# Patient Record
Sex: Male | Born: 1960 | Race: Black or African American | Hispanic: No | Marital: Married | Smoking: Current every day smoker
Health system: Southern US, Community
[De-identification: ages and names within clinical notes are randomized; demographics above are authoritative.]

## PROBLEM LIST (undated history)

## (undated) DIAGNOSIS — Z789 Other specified health status: Secondary | ICD-10-CM

## (undated) HISTORY — PX: BACK SURGERY: SHX140

---

## 2009-05-04 ENCOUNTER — Emergency Department (HOSPITAL_COMMUNITY): Admission: EM | Admit: 2009-05-04 | Discharge: 2009-05-04 | Payer: Self-pay | Admitting: Emergency Medicine

## 2009-07-21 ENCOUNTER — Inpatient Hospital Stay: Payer: Self-pay | Admitting: Internal Medicine

## 2009-08-08 ENCOUNTER — Ambulatory Visit: Payer: Self-pay | Admitting: Internal Medicine

## 2009-09-03 ENCOUNTER — Encounter (INDEPENDENT_AMBULATORY_CARE_PROVIDER_SITE_OTHER): Payer: Self-pay | Admitting: Family Medicine

## 2009-09-03 ENCOUNTER — Ambulatory Visit: Payer: Self-pay | Admitting: Internal Medicine

## 2009-09-03 LAB — CONVERTED CEMR LAB
ALT: 11 units/L (ref 0–53)
AST: 20 units/L (ref 0–37)
Albumin: 4.1 g/dL (ref 3.5–5.2)
Alkaline Phosphatase: 64 units/L (ref 39–117)
BUN: 9 mg/dL (ref 6–23)
Basophils Absolute: 0 10*3/uL (ref 0.0–0.1)
Basophils Relative: 0 % (ref 0–1)
CO2: 24 meq/L (ref 19–32)
Calcium: 8.8 mg/dL (ref 8.4–10.5)
Chloride: 103 meq/L (ref 96–112)
Cholesterol: 206 mg/dL — ABNORMAL HIGH (ref 0–200)
Creatinine, Ser: 0.84 mg/dL (ref 0.40–1.50)
Eosinophils Absolute: 0.1 10*3/uL (ref 0.0–0.7)
Eosinophils Relative: 1 % (ref 0–5)
Glucose, Bld: 111 mg/dL — ABNORMAL HIGH (ref 70–99)
HCT: 42.8 % (ref 39.0–52.0)
HDL: 56 mg/dL (ref >39)
Helicobacter Pylori Antibody-IgG: 2.8 — ABNORMAL HIGH
Hemoglobin: 14 g/dL (ref 13.0–17.0)
Hepatitis B Surface Ag: NEGATIVE
LDL Cholesterol: 132 mg/dL — ABNORMAL HIGH (ref 0–99)
Lymphocytes Relative: 21 % (ref 12–46)
Lymphs Abs: 2 10*3/uL (ref 0.7–4.0)
MCHC: 32.7 g/dL (ref 30.0–36.0)
MCV: 86.6 fL (ref 78.0–100.0)
Monocytes Absolute: 0.6 10*3/uL (ref 0.1–1.0)
Monocytes Relative: 6 % (ref 3–12)
Neutro Abs: 6.6 10*3/uL (ref 1.7–7.7)
Neutrophils Relative %: 71 % (ref 43–77)
Platelets: 287 10*3/uL (ref 150–400)
Potassium: 3.7 meq/L (ref 3.5–5.3)
RBC: 4.94 M/uL (ref 4.22–5.81)
RDW: 15 % (ref 11.5–15.5)
Sodium: 137 meq/L (ref 135–145)
Total Bilirubin: 0.6 mg/dL (ref 0.3–1.2)
Total CHOL/HDL Ratio: 3.7
Total Protein: 7.2 g/dL (ref 6.0–8.3)
Triglycerides: 92 mg/dL (ref <150)
VLDL: 18 mg/dL (ref 0–40)
WBC: 9.2 10*3/uL (ref 4.0–10.5)

## 2009-09-05 ENCOUNTER — Ambulatory Visit: Payer: Self-pay | Admitting: Internal Medicine

## 2009-09-24 ENCOUNTER — Ambulatory Visit: Payer: Self-pay | Admitting: Family Medicine

## 2010-01-07 ENCOUNTER — Ambulatory Visit: Payer: Self-pay | Admitting: Family Medicine

## 2010-04-08 ENCOUNTER — Emergency Department (HOSPITAL_COMMUNITY): Admission: EM | Admit: 2010-04-08 | Discharge: 2010-04-08 | Payer: Self-pay | Admitting: Emergency Medicine

## 2010-04-11 ENCOUNTER — Encounter (INDEPENDENT_AMBULATORY_CARE_PROVIDER_SITE_OTHER): Payer: Self-pay | Admitting: *Deleted

## 2010-04-11 LAB — CONVERTED CEMR LAB
TSH: 2.072 microintl units/mL (ref 0.350–4.500)
Vit D, 25-Hydroxy: 20 ng/mL — ABNORMAL LOW (ref 30–89)

## 2010-05-11 ENCOUNTER — Emergency Department (HOSPITAL_COMMUNITY)
Admission: EM | Admit: 2010-05-11 | Discharge: 2010-05-11 | Payer: Self-pay | Source: Home / Self Care | Admitting: Emergency Medicine

## 2010-07-10 ENCOUNTER — Emergency Department (HOSPITAL_COMMUNITY)
Admission: EM | Admit: 2010-07-10 | Discharge: 2010-07-10 | Payer: Self-pay | Source: Home / Self Care | Admitting: Emergency Medicine

## 2010-07-10 LAB — CBC
HCT: 41.7 % (ref 39.0–52.0)
Hemoglobin: 13.8 g/dL (ref 13.0–17.0)
MCH: 28.5 pg (ref 26.0–34.0)
MCHC: 33.1 g/dL (ref 30.0–36.0)
MCV: 86.2 fL (ref 78.0–100.0)
Platelets: 253 10*3/uL (ref 150–400)
RBC: 4.84 MIL/uL (ref 4.22–5.81)
RDW: 14.4 % (ref 11.5–15.5)
WBC: 6.7 10*3/uL (ref 4.0–10.5)

## 2010-07-10 LAB — HEPATIC FUNCTION PANEL
ALT: 16 U/L (ref 0–53)
AST: 28 U/L (ref 0–37)
Albumin: 3.4 g/dL — ABNORMAL LOW (ref 3.5–5.2)
Alkaline Phosphatase: 60 U/L (ref 39–117)
Bilirubin, Direct: 0.2 mg/dL (ref 0.0–0.3)
Indirect Bilirubin: 0.2 mg/dL — ABNORMAL LOW (ref 0.3–0.9)
Total Bilirubin: 0.4 mg/dL (ref 0.3–1.2)
Total Protein: 6.7 g/dL (ref 6.0–8.3)

## 2010-07-10 LAB — BASIC METABOLIC PANEL
BUN: 9 mg/dL (ref 6–23)
CO2: 27 mEq/L (ref 19–32)
Calcium: 8.8 mg/dL (ref 8.4–10.5)
Chloride: 107 mEq/L (ref 96–112)
Creatinine, Ser: 0.93 mg/dL (ref 0.4–1.5)
GFR calc Af Amer: >60 mL/min (ref >60)
GFR calc non Af Amer: >60 mL/min (ref >60)
Glucose, Bld: 110 mg/dL — ABNORMAL HIGH (ref 70–99)
Potassium: 4.4 mEq/L (ref 3.5–5.1)
Sodium: 141 mEq/L (ref 135–145)

## 2010-07-10 LAB — DIFFERENTIAL
Basophils Absolute: 0 10*3/uL (ref 0.0–0.1)
Basophils Relative: 0 % (ref 0–1)
Eosinophils Absolute: 0.2 10*3/uL (ref 0.0–0.7)
Eosinophils Relative: 3 % (ref 0–5)
Lymphocytes Relative: 37 % (ref 12–46)
Lymphs Abs: 2.4 10*3/uL (ref 0.7–4.0)
Monocytes Absolute: 0.4 10*3/uL (ref 0.1–1.0)
Monocytes Relative: 7 % (ref 3–12)
Neutro Abs: 3.6 10*3/uL (ref 1.7–7.7)
Neutrophils Relative %: 54 % (ref 43–77)

## 2010-07-10 LAB — RAPID URINE DRUG SCREEN, HOSP PERFORMED
Amphetamines: NOT DETECTED
Barbiturates: NOT DETECTED
Benzodiazepines: NOT DETECTED
Cocaine: POSITIVE — AB
Opiates: POSITIVE — AB
Tetrahydrocannabinol: POSITIVE — AB

## 2010-07-10 LAB — ETHANOL: Alcohol, Ethyl (B): <5 mg/dL (ref 0–10)

## 2010-07-11 ENCOUNTER — Emergency Department: Payer: Self-pay | Admitting: Unknown Physician Specialty

## 2010-09-16 LAB — DIFFERENTIAL
Basophils Absolute: 0 10*3/uL (ref 0.0–0.1)
Basophils Relative: 0 % (ref 0–1)
Eosinophils Absolute: 0 10*3/uL (ref 0.0–0.7)
Eosinophils Relative: 0 % (ref 0–5)
Lymphocytes Relative: 14 % (ref 12–46)
Lymphs Abs: 1.3 10*3/uL (ref 0.7–4.0)
Monocytes Absolute: 0.4 10*3/uL (ref 0.1–1.0)
Monocytes Relative: 4 % (ref 3–12)
Neutro Abs: 8.1 10*3/uL — ABNORMAL HIGH (ref 1.7–7.7)
Neutrophils Relative %: 83 % — ABNORMAL HIGH (ref 43–77)

## 2010-09-16 LAB — BASIC METABOLIC PANEL
BUN: 13 mg/dL (ref 6–23)
CO2: 28 mEq/L (ref 19–32)
Calcium: 9.4 mg/dL (ref 8.4–10.5)
Chloride: 99 mEq/L (ref 96–112)
Creatinine, Ser: 1.12 mg/dL (ref 0.4–1.5)
GFR calc Af Amer: >60 mL/min (ref >60)
GFR calc non Af Amer: >60 mL/min (ref >60)
Glucose, Bld: 189 mg/dL — ABNORMAL HIGH (ref 70–99)
Potassium: 4.3 mEq/L (ref 3.5–5.1)
Sodium: 138 mEq/L (ref 135–145)

## 2010-09-16 LAB — CBC
HCT: 51 % (ref 39.0–52.0)
Hemoglobin: 17.2 g/dL — ABNORMAL HIGH (ref 13.0–17.0)
MCH: 29.1 pg (ref 26.0–34.0)
MCHC: 33.7 g/dL (ref 30.0–36.0)
MCV: 86.4 fL (ref 78.0–100.0)
Platelets: 278 10*3/uL (ref 150–400)
RBC: 5.91 MIL/uL — ABNORMAL HIGH (ref 4.22–5.81)
RDW: 13 % (ref 11.5–15.5)
WBC: 9.8 10*3/uL (ref 4.0–10.5)

## 2010-09-16 LAB — BRAIN NATRIURETIC PEPTIDE: Pro B Natriuretic peptide (BNP): <30 pg/mL (ref 0.0–100.0)

## 2010-09-16 LAB — POCT CARDIAC MARKERS
CKMB, poc: <1 ng/mL — ABNORMAL LOW (ref 1.0–8.0)
Myoglobin, poc: 149 ng/mL (ref 12–200)
Troponin i, poc: <0.05 ng/mL (ref 0.00–0.09)

## 2010-10-09 LAB — URINE MICROSCOPIC-ADD ON

## 2010-10-09 LAB — COMPREHENSIVE METABOLIC PANEL
ALT: 13 U/L (ref 0–53)
AST: 21 U/L (ref 0–37)
Albumin: 4.5 g/dL (ref 3.5–5.2)
Alkaline Phosphatase: 67 U/L (ref 39–117)
BUN: 7 mg/dL (ref 6–23)
CO2: 28 mEq/L (ref 19–32)
Calcium: 9.5 mg/dL (ref 8.4–10.5)
Chloride: 98 mEq/L (ref 96–112)
Creatinine, Ser: 1.2 mg/dL (ref 0.4–1.5)
GFR calc Af Amer: >60 mL/min (ref >60)
GFR calc non Af Amer: >60 mL/min (ref >60)
Glucose, Bld: 115 mg/dL — ABNORMAL HIGH (ref 70–99)
Potassium: 4.2 mEq/L (ref 3.5–5.1)
Sodium: 136 mEq/L (ref 135–145)
Total Bilirubin: 1.2 mg/dL (ref 0.3–1.2)
Total Protein: 8.5 g/dL — ABNORMAL HIGH (ref 6.0–8.3)

## 2010-10-09 LAB — URINALYSIS, ROUTINE W REFLEX MICROSCOPIC
Glucose, UA: NEGATIVE mg/dL
Hgb urine dipstick: NEGATIVE
Ketones, ur: >80 mg/dL — AB
Leukocytes, UA: NEGATIVE
Nitrite: NEGATIVE
Protein, ur: 30 mg/dL — AB
Specific Gravity, Urine: 1.036 — ABNORMAL HIGH (ref 1.005–1.030)
Urobilinogen, UA: 1 mg/dL (ref 0.0–1.0)
pH: 5.5 (ref 5.0–8.0)

## 2010-10-09 LAB — LACTATE DEHYDROGENASE: LDH: 176 U/L (ref 94–250)

## 2010-10-09 LAB — POCT CARDIAC MARKERS
CKMB, poc: <1 ng/mL — ABNORMAL LOW (ref 1.0–8.0)
Myoglobin, poc: 130 ng/mL (ref 12–200)
Troponin i, poc: <0.05 ng/mL (ref 0.00–0.09)

## 2010-10-09 LAB — DIFFERENTIAL
Basophils Absolute: 0 10*3/uL (ref 0.0–0.1)
Basophils Relative: 0 % (ref 0–1)
Eosinophils Absolute: 0 10*3/uL (ref 0.0–0.7)
Eosinophils Relative: 1 % (ref 0–5)
Lymphocytes Relative: 23 % (ref 12–46)
Lymphs Abs: 2.1 10*3/uL (ref 0.7–4.0)
Monocytes Absolute: 0.5 10*3/uL (ref 0.1–1.0)
Monocytes Relative: 6 % (ref 3–12)
Neutro Abs: 6.4 10*3/uL (ref 1.7–7.7)
Neutrophils Relative %: 71 % (ref 43–77)

## 2010-10-09 LAB — CBC
HCT: 50.8 % (ref 39.0–52.0)
Hemoglobin: 17.2 g/dL — ABNORMAL HIGH (ref 13.0–17.0)
MCHC: 33.9 g/dL (ref 30.0–36.0)
MCV: 88.6 fL (ref 78.0–100.0)
Platelets: 267 10*3/uL (ref 150–400)
RBC: 5.73 MIL/uL (ref 4.22–5.81)
RDW: 13 % (ref 11.5–15.5)
WBC: 9.1 10*3/uL (ref 4.0–10.5)

## 2010-10-09 LAB — LIPASE, BLOOD: Lipase: 27 U/L (ref 11–59)

## 2011-02-26 IMAGING — CR DG CHEST 2V
2 series · 2 of 2 positions shown · non-contrast
Comparison: None.

CLINICAL DATA: Abdominal pain.  Cough.  Recent pneumonia.

CHEST - 2 VIEW

[w chest pa]
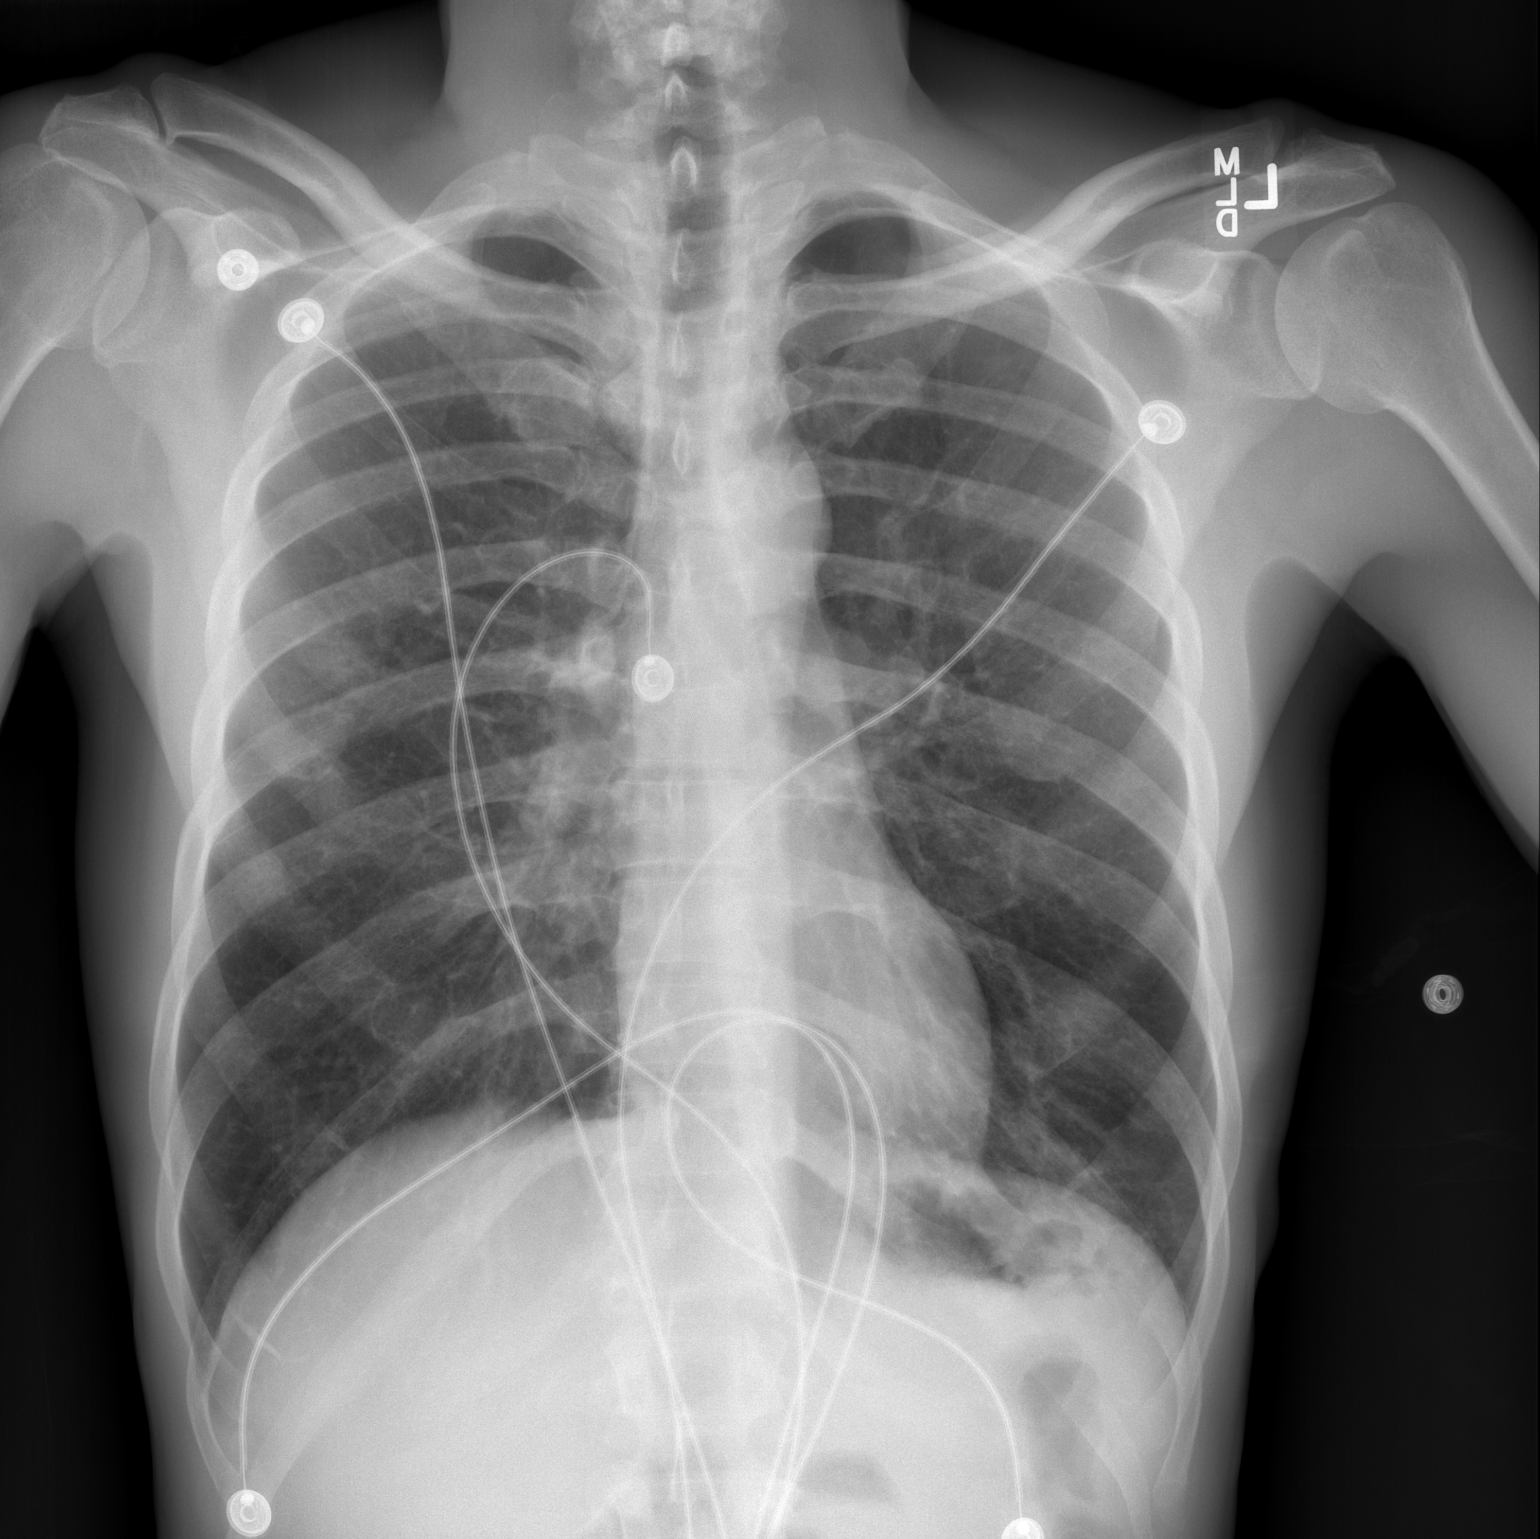

[w chest lat]
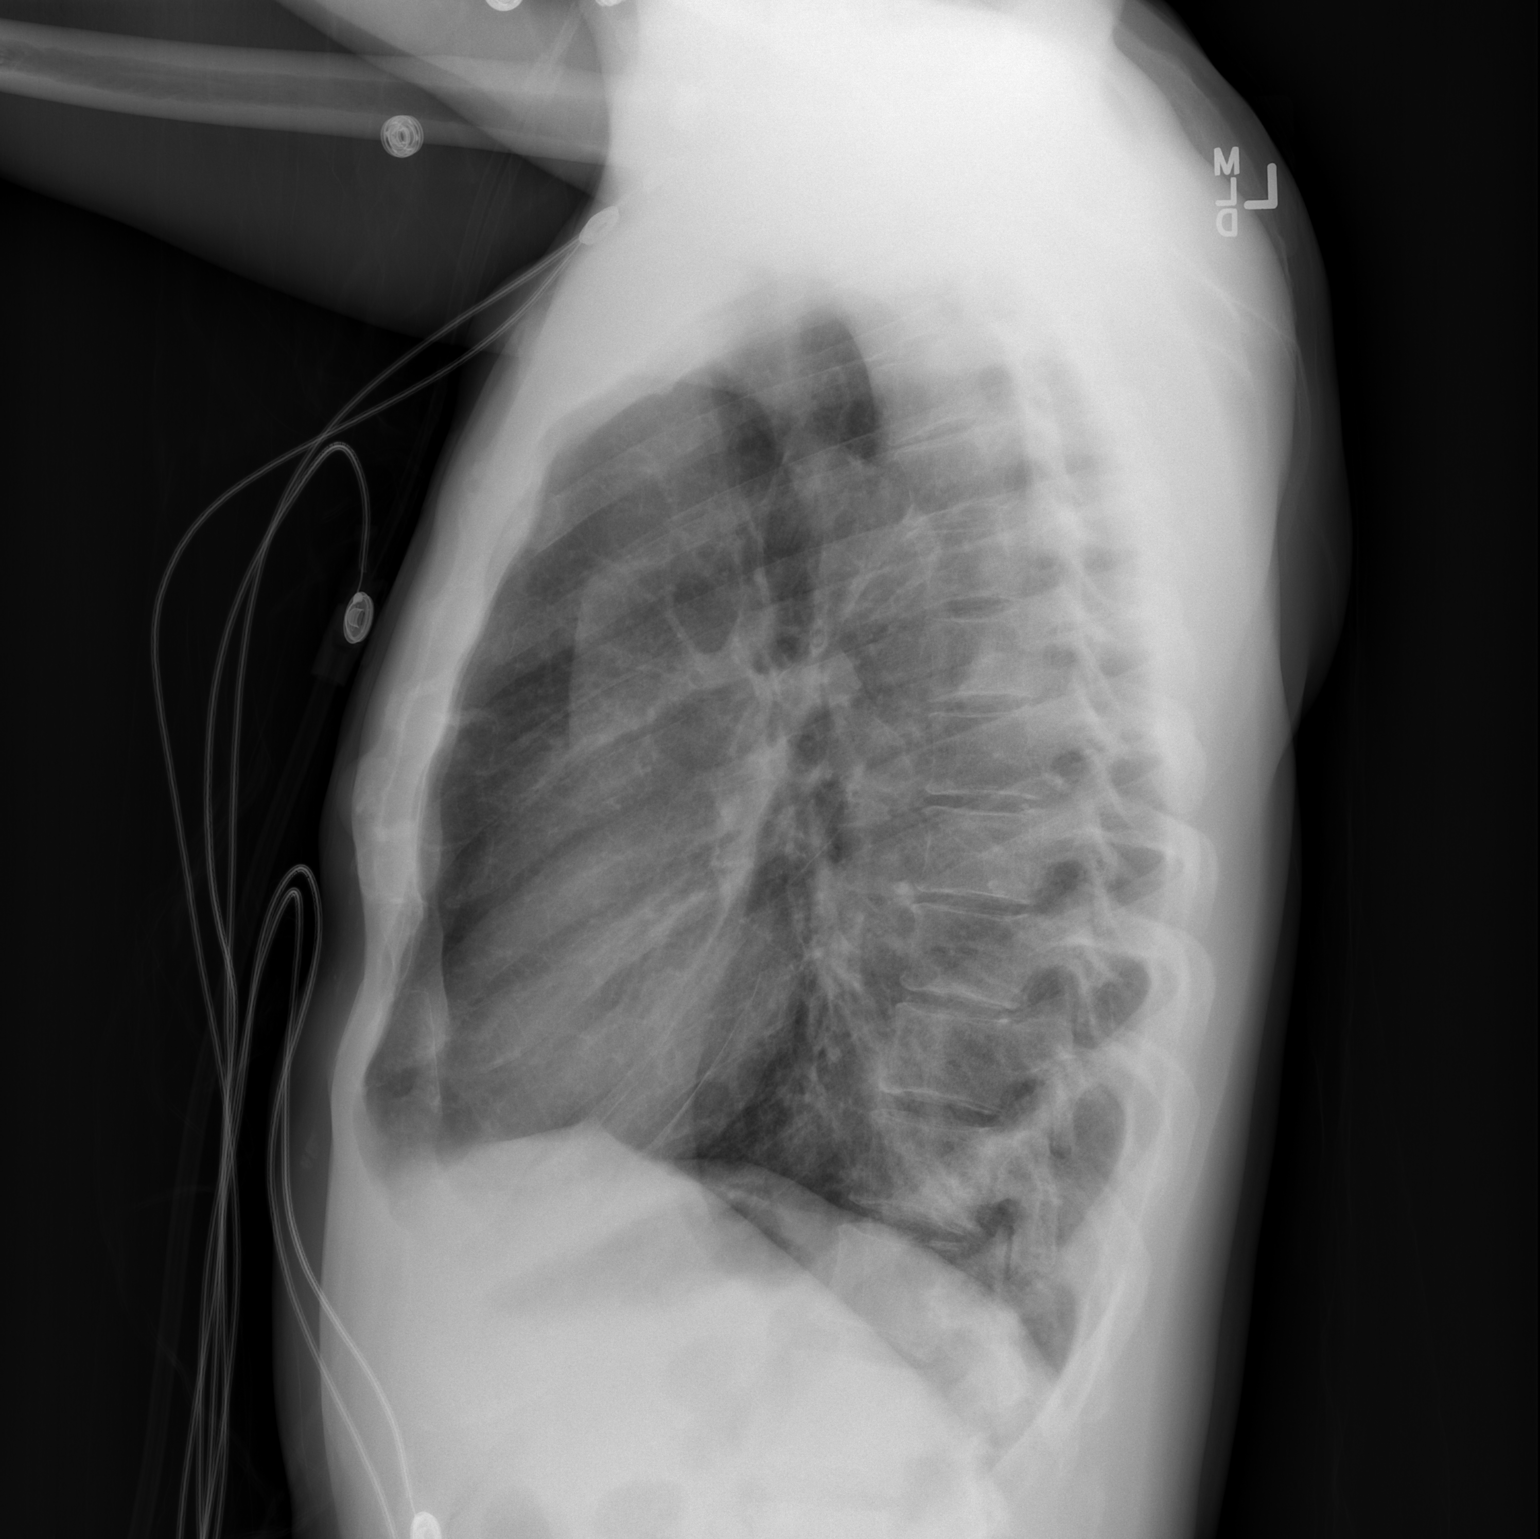

[2 of 2 positions shown; findings below may reference images not displayed]

FINDINGS: The heart size is normal.  Minimal left basilar airspace
disease likely reflects atelectasis.  Early or resolving pneumonia
is not excluded.  The upper lung fields are clear.  The visualized
soft tissues and bony thorax are unremarkable.
IMPRESSION: Minimal left basilar airspace disease.  This likely reflects
atelectasis, but early or resolving pneumonia is not excluded.

## 2013-10-05 ENCOUNTER — Emergency Department: Payer: Self-pay | Admitting: Emergency Medicine

## 2013-10-05 LAB — DRUG SCREEN, URINE
Amphetamines, Ur Screen: NEGATIVE (ref ?–1000)
Barbiturates, Ur Screen: NEGATIVE (ref ?–200)
Benzodiazepine, Ur Scrn: NEGATIVE (ref ?–200)
Cannabinoid 50 Ng, Ur ~~LOC~~: POSITIVE (ref ?–50)
Cocaine Metabolite,Ur ~~LOC~~: POSITIVE (ref ?–300)
MDMA (Ecstasy)Ur Screen: NEGATIVE (ref ?–500)
Methadone, Ur Screen: NEGATIVE (ref ?–300)
Opiate, Ur Screen: POSITIVE (ref ?–300)
Phencyclidine (PCP) Ur S: NEGATIVE (ref ?–25)
Tricyclic, Ur Screen: NEGATIVE (ref ?–1000)

## 2013-10-05 LAB — ETHANOL
Ethanol %: 0.063 % (ref 0.000–0.080)
Ethanol: 63 mg/dL

## 2014-11-01 ENCOUNTER — Encounter (HOSPITAL_COMMUNITY): Payer: Self-pay

## 2014-11-01 ENCOUNTER — Emergency Department (HOSPITAL_COMMUNITY)
Admission: EM | Admit: 2014-11-01 | Discharge: 2014-11-01 | Disposition: A | Discharge status: Home / Self Care | Payer: Self-pay | Attending: Emergency Medicine | Admitting: Emergency Medicine

## 2014-11-01 DIAGNOSIS — Z720 Tobacco use: Secondary | ICD-10-CM | POA: Insufficient documentation

## 2014-11-01 DIAGNOSIS — M62838 Other muscle spasm: Secondary | ICD-10-CM | POA: Insufficient documentation

## 2014-11-01 DIAGNOSIS — M25551 Pain in right hip: Secondary | ICD-10-CM | POA: Insufficient documentation

## 2014-11-01 MED ORDER — LIDOCAINE 5 % EX PTCH: 1.0000 | MEDICATED_PATCH | CUTANEOUS | Status: AC

## 2014-11-01 MED ORDER — CYCLOBENZAPRINE HCL 5 MG PO TABS: 5.0000 mg | ORAL_TABLET | Freq: Three times a day (TID) | ORAL | Status: AC | PRN

## 2014-11-01 MED ORDER — NAPROXEN 500 MG PO TABS: 500.0000 mg | ORAL_TABLET | Freq: Two times a day (BID) | ORAL | Status: DC

## 2014-11-01 NOTE — Discharge Instructions (Signed)
Hip Bursitis Bursitis is a swelling and soreness (inflammation) of a fluid-filled sac (bursa). This sac overlies and protects the joints.  CAUSES   Injury.  Overuse of the muscles surrounding the joint.  Arthritis.  Gout.  Infection.  Cold weather.  Inadequate warm-up and conditioning prior to activities. The cause may not be known.  SYMPTOMS   Mild to severe irritation.  Tenderness and swelling over the outside of the hip.  Pain with motion of the hip.  If the bursa becomes infected, a fever may be present. Redness, tenderness, and warmth will develop over the hip. Symptoms usually lessen in 3 to 4 weeks with treatment, but can come back. TREATMENT If conservative treatment does not work, your caregiver may advise draining the bursa and injecting cortisone into the area. This may speed up the healing process. This may also be used as an initial treatment of choice. HOME CARE INSTRUCTIONS   Apply ice to the affected area for 15-20 minutes every 3 to 4 hours while awake for the first 2 days. Put the ice in a plastic bag and place a towel between the bag of ice and your skin.  Rest the painful joint as much as possible, but continue to put the joint through a normal range of motion at least 4 times per day. When the pain lessens, begin normal, slow movements and usual activities to help prevent stiffness of the hip.  Only take over-the-counter or prescription medicines for pain, discomfort, or fever as directed by your caregiver.  Use crutches to limit weight bearing on the hip joint, if advised.  Elevate your painful hip to reduce swelling. Use pillows for propping and cushioning your legs and hips.  Gentle massage may provide comfort and decrease swelling. SEEK IMMEDIATE MEDICAL CARE IF:   Your pain increases even during treatment, or you are not improving.  You have a fever.  You have heat and inflammation over the involved bursa.  You have any other questions or  concerns. MAKE SURE YOU:   Understand these instructions.  Will watch your condition.  Will get help right away if you are not doing well or get worse. Document Released: 12/12/2001 Document Revised: 09/14/2011 Document Reviewed: 07/11/2008 [employer] Patient Information 2015 [employer]. This information is not intended to replace advice given to you by your health care provider. Make sure you discuss any questions you have with your health care provider.  

## 2014-11-01 NOTE — ED Provider Notes (Signed)
CSN: [id number]     Arrival date & time 11/01/14  1146 History  This chart was scribed for non-physician practitioner, Arthor Captain, PA-C working with Gerhard Munch, MD, by Abel Presto, ED Scribe. This patient was seen in room WTR7/WTR7 and the patient's care was started at 1:07 PM.     Chief Complaint  Patient presents with  . Neck Pain  . Hip Pain    Patient is a 54 y.o. male presenting with neck pain and hip pain. The history is provided by the patient. No language interpreter was used.  Neck Pain Associated symptoms: no fever, no numbness and no weakness   Hip Pain   HPI Comments: George Rich is a 54 y.o. male with no significant PMHx who presents to the Emergency Department complaining of 7/10 worsening achy throbbing right hip and right trapezius pain with onset 1 week ago. Pt states ambulation and movement of arm worsens the pain. Pt has not taken any medication for relief. Pt denies known injury, falls or direct trauma, any changes in where he sleeps, h/o injury to shoulder or hip, and limited ROM. Pt denies swelling or warmth to the area, fever, numbness, weakness, and back pain.   History reviewed. No pertinent past medical history. History reviewed. No pertinent past surgical history. History reviewed. No pertinent family history. History  Substance Use Topics  . Smoking status: Current Every Day Smoker -- 0.50 packs/day    Types: Cigarettes  . Smokeless tobacco: Never Used  . Alcohol Use: No    Review of Systems  Constitutional: Negative for fever.  Musculoskeletal: Positive for myalgias, arthralgias and neck pain. Negative for back pain and joint swelling.  Neurological: Negative for weakness and numbness.     Allergies  Review of patient's allergies indicates not on file.  Home Medications   Prior to Admission medications   Medication Sig Start Date End Date Taking? Authorizing Provider  cyclobenzaprine (FLEXERIL) 5 MG tablet Take 1 tablet (5 mg  total) by mouth 3 (three) times daily as needed for muscle spasms. 11/01/14   Arthor Captain, PA-C  lidocaine (LIDODERM) 5 % Place 1 patch onto the skin daily. Remove & Discard patch within 12 hours or as directed by MD 11/01/14   Arthor Captain, PA-C  naproxen (NAPROSYN) 500 MG tablet Take 1 tablet (500 mg total) by mouth 2 (two) times daily with a meal. 11/01/14   Arthor Captain, PA-C   BP 134/79 mmHg  Pulse 77  Temp(Src) 97.7 F (36.5 C) (Oral)  Resp 16  SpO2 96% Physical Exam  Constitutional: He is oriented to person, place, and time. He appears well-developed and well-nourished.  HENT:  Head: Normocephalic.  Eyes: Conjunctivae are normal.  Neck: Normal range of motion. Neck supple.  Pulmonary/Chest: Effort normal.  Musculoskeletal: Normal range of motion.  Pain in right trapezius, increased with extension of shoulder Full ROM Point tender to trochanter of right hip  Neurological: He is alert and oriented to person, place, and time.  Skin: Skin is warm and dry.  Psychiatric: He has a normal mood and affect. His behavior is normal.  Nursing note and vitals reviewed.   ED Course  Procedures (including critical care time) DIAGNOSTIC STUDIES: Oxygen Saturation is 96% on room air, normal by my interpretation.    COORDINATION OF CARE: 1:13 PM Discussed treatment plan with patient at beside, the patient agrees with the plan and has no further questions at this time.   Labs Review Labs Reviewed - No data  to display  Imaging Review No results found.   EKG Interpretation None      Meds ordered this encounter  Medications  . naproxen (NAPROSYN) 500 MG tablet    Sig: Take 1 tablet (500 mg total) by mouth 2 (two) times daily with a meal.    Dispense:  30 tablet    Refill:  0    Order Specific Question:  Supervising Provider    Answer:  MILLER, BRIAN [[id number]]  . lidocaine (LIDODERM) 5 %    Sig: Place 1 patch onto the skin daily. Remove & Discard patch within 12 hours or as  directed by MD    Dispense:  30 patch    Refill:  0    Order Specific Question:  Supervising Provider    Answer:  Hyacinth MeekerMILLER, BRIAN [[id number]]  . cyclobenzaprine (FLEXERIL) 5 MG tablet    Sig: Take 1 tablet (5 mg total) by mouth 3 (three) times daily as needed for muscle spasms.    Dispense:  30 tablet    Refill:  0    Order Specific Question:  Supervising Provider    Answer:  Eber HongMILLER, BRIAN [[id number]]     MDM   Filed Vitals:   11/01/14 1152  BP: 134/79  Pulse: 77  Temp: 97.7 F (36.5 C)  TempSrc: Oral  Resp: 16  SpO2: 96%    Final diagnoses:  Trapezius muscle spasm  Hip pain, right   patient with right trapezius muscle pain Patient with point tenderness over the right trochanter. Concern for trochanteric bursitis. No overt signs of infection. He is afebrile. No tachycardia. He denies any symptoms of systemic infection. Apparently patient is a daily heroin user. I advised the patient to return for worsening symptoms, development of fever, body aches, chills, or other signs of systemic infection. He appears safe for discharge at this time. Treat with naproxen, Flexeril and Lidoderm patch. Follow-up with orthopedics.  I personally performed the services described in this documentation, which was scribed in my presence. The recorded information has been reviewed and is accurate.        Arthor Captainbigail Xochitl Egle, PA-C 11/01/14 1339  Gerhard Munchobert Lockwood, MD 11/01/14 [phone]

## 2014-11-01 NOTE — ED Notes (Signed)
AVS explained in detail. Proper crutch use displayed. Knows to take medications as prescribed and to return with new/worsening pain or new onset fevers. No other c/c. Ambulatory with steady gait.

## 2014-11-01 NOTE — ED Notes (Signed)
Patient states he has right hip, right lateral neck and right arm pain x 1 week.

## 2015-05-13 ENCOUNTER — Emergency Department (HOSPITAL_COMMUNITY): Payer: Self-pay

## 2015-05-13 ENCOUNTER — Encounter (HOSPITAL_COMMUNITY): Payer: Self-pay | Admitting: Emergency Medicine

## 2015-05-13 ENCOUNTER — Emergency Department (HOSPITAL_COMMUNITY)
Admission: EM | Admit: 2015-05-13 | Discharge: 2015-05-13 | Disposition: A | Discharge status: Home / Self Care | Payer: Self-pay | Attending: Emergency Medicine | Admitting: Emergency Medicine

## 2015-05-13 DIAGNOSIS — Z720 Tobacco use: Secondary | ICD-10-CM | POA: Insufficient documentation

## 2015-05-13 DIAGNOSIS — F1120 Opioid dependence, uncomplicated: Secondary | ICD-10-CM | POA: Insufficient documentation

## 2015-05-13 DIAGNOSIS — M25551 Pain in right hip: Secondary | ICD-10-CM | POA: Insufficient documentation

## 2015-05-13 MED ORDER — NAPROXEN 500 MG PO TABS: 500.0000 mg | ORAL_TABLET | Freq: Two times a day (BID) | ORAL | Status: AC

## 2015-05-13 MED ORDER — ONDANSETRON HCL 4 MG PO TABS: 4.0000 mg | ORAL_TABLET | Freq: Three times a day (TID) | ORAL | Status: AC | PRN

## 2015-05-13 MED ORDER — HYDROXYZINE HCL 25 MG PO TABS: 25.0000 mg | ORAL_TABLET | Freq: Four times a day (QID) | ORAL | Status: AC

## 2015-05-13 MED ORDER — DICYCLOMINE HCL 20 MG PO TABS
20.0000 mg | ORAL_TABLET | Freq: Once | ORAL | Status: AC
  Administered 2015-05-13: 20 mg via ORAL
  Filled 2015-05-13: qty 1

## 2015-05-13 MED ORDER — ACETAMINOPHEN 500 MG PO TABS
1000.0000 mg | ORAL_TABLET | Freq: Once | ORAL | Status: AC
  Administered 2015-05-13: 1000 mg via ORAL
  Filled 2015-05-13: qty 2

## 2015-05-13 MED ORDER — DICYCLOMINE HCL 20 MG PO TABS: 20.0000 mg | ORAL_TABLET | Freq: Two times a day (BID) | ORAL | Status: AC

## 2015-05-13 NOTE — ED Provider Notes (Signed)
CSN: [id number]     Arrival date & time 05/13/15  0940 History   First MD Initiated Contact with Patient 05/13/15 1019     Chief Complaint  Patient presents with  . Addiction Problem  . Extremity Pain     (Consider location/radiation/quality/duration/timing/severity/associated sxs/prior Treatment) HPI  George Rich Is a 54 year old male who presents emergency Department with chief complaint of right hip pain, left hand pain, and heroin addiction. The patient states that about a month ago he fell on his right hip and has had pains in that hip since that time. He denies a history of chronic hip pain. However, I personally seen this patient for hip pain in the past. He also complains of numbness in his left ham which she describes as constant, ongoing for many months. He denies chest pain or shortness of breath. The patient also states that he is addicted to snorting heroin. He states he started about a year ago. He denies any other drug use. He denies alcohol abuse. Patient states his last use was on Saturday, 2 days ago. He denies suicidal ideation, homicidal ideation, auditory or visual hallucinations.  History reviewed. No pertinent past medical history. History reviewed. No pertinent past surgical history. No family history on file. Social History  Substance Use Topics  . Smoking status: Current Every Day Smoker -- 0.50 packs/day    Types: Cigarettes  . Smokeless tobacco: Never Used  . Alcohol Use: No    Review of Systems  Ten systems reviewed and are negative for acute change, except as noted in the HPI.     Allergies  Review of patient's allergies indicates not on file.  Home Medications   Prior to Admission medications   Medication Sig Start Date End Date Taking? Authorizing Provider  cyclobenzaprine (FLEXERIL) 5 MG tablet Take 1 tablet (5 mg total) by mouth 3 (three) times daily as needed for muscle spasms. Patient not taking: Reported on 05/13/2015 11/01/14   Arthor CaptainAbigail  Kandra Graven, PA-C  lidocaine (LIDODERM) 5 % Place 1 patch onto the skin daily. Remove & Discard patch within 12 hours or as directed by MD Patient not taking: Reported on 05/13/2015 11/01/14   Arthor CaptainAbigail Dmitriy Gair, PA-C  naproxen (NAPROSYN) 500 MG tablet Take 1 tablet (500 mg total) by mouth 2 (two) times daily with a meal. Patient not taking: Reported on 05/13/2015 11/01/14   Arthor CaptainAbigail Zayquan Bogard, PA-C   BP 99/63 mmHg  Pulse 79  Temp(Src) 97.9 F (36.6 C) (Oral)  Resp 19  SpO2 99% Physical Exam  Constitutional: He is oriented to person, place, and time. He appears well-developed and well-nourished. No distress.  HENT:  Head: Normocephalic and atraumatic.  Eyes: Conjunctivae are normal. No scleral icterus.  Neck: Normal range of motion. Neck supple.  Cardiovascular: Normal rate, regular rhythm and normal heart sounds.   Pulmonary/Chest: Effort normal and breath sounds normal. No respiratory distress.  Abdominal: Soft. There is no tenderness.  Musculoskeletal: He exhibits no edema.  FROM o,f hip R Antalgic gait, neurovascularly intact.  Left hand, NVI, Normal grip strength. FROM/ strength of fingers, hand and wrist  Neurological: He is alert and oriented to person, place, and time.  Skin: Skin is warm and dry. He is not diaphoretic.  Psychiatric: His behavior is normal.  Nursing note and vitals reviewed.   ED Course  Procedures (including critical care time) Labs Review Labs Reviewed - No data to display  Imaging Review Dg Hip Unilat With Pelvis 2-3 Views Right  05/13/2015  CLINICAL  DATA:  Persistent pain after fall several months prior EXAM: DG HIP (WITH OR WITHOUT PELVIS) 2-3V RIGHT COMPARISON:  None. FINDINGS: Frontal pelvis as well as frontal and lateral right hip images were obtained. There is no demonstrable fracture or dislocation. The joint spaces appear intact. No erosive change. IMPRESSION: No fracture or dislocation.  No appreciable arthropathy. Electronically Signed   By: Bretta Bang III M.D.   On: 05/13/2015 12:28   I have personally reviewed and evaluated these images and lab results as part of my medical decision-making.   EKG Interpretation None      MDM   Final diagnoses:  Heroin addiction (HCC)  Hip pain, right    Patient xray negative. No signs of acute withdrawal Able to ambulate  denies SI/HI/AVH Will dc with meds. Op referral.    Arthor Captain, PA-C 05/14/15 1759  Laurence Spates, MD 05/15/15 1004

## 2015-05-13 NOTE — ED Notes (Signed)
Pt reporting he has pain in his left hand and right leg, reports he stepped in a hole and fell a few months ago. Pt also reporting he wants to get off of heroin, reports last use was Saturday. Ambulatory to room, patient appeared steady on his feet.

## 2015-05-13 NOTE — Discharge Instructions (Signed)
Finding Treatment for Addiction WHAT IS ADDICTION? Addiction is a complex disease of the brain. It causes an uncontrollable (compulsive) need for a substance. You can be addicted to alcohol, illegal drugs, or prescription medicines such as painkillers. Addiction can also be a behavior, like gambling or shopping. The need for the drug or activity can become so strong that you think about it all the time. You can also become physically dependent on a substance. Addiction can change the way your brain works. Because of these changes, getting more of whatever you are addicted to becomes the most important thing to you and feels better than other activities or relationships. Addiction can lead to changes in health, behavior, emotions, relationships, and choices that affect you and everyone around you. HOW DO I KNOW IF I NEED TREATMENT FOR ADDICTION? Addiction is a progressive disease. Without treatment, addiction can get worse. Living with addiction puts you at higher risk for injury, poor health, lost employment, loss of money, and even death. You might need treatment for addiction if:  You have tried to stop or cut down, but you cannot.  Your addiction is causing physical health problems.  You find it annoying that your friends and family are concerned about your alcohol or substance use.  You feel guilty about substance abuse or a compulsive behavior.  You have lied or tried to hide your addiction.  You need a particular substance or activity to start your day or to calm down.  You are getting in trouble at school, work, home, or with the police.  You have done something illegal to support your addiction.  You are running out of money because of your addiction.  You have no time for anything other than your addiction. WHAT TYPES OF TREATMENT ARE AVAILABLE? The treatment program that is right for you will depend on many factors, including the type of addiction you have. Treatment programs  can be outpatient or inpatient. In an outpatient program, you live at home and go to work or school, but you also go to a clinic for treatment. With an inpatient program, you live and sleep at the program facility during treatment. After treatment, you might need a plan for support during recovery. Other treatment options include:   Medicine.  Some addictions may be treated with prescription medicines.  You might also need medicine to treat anxiety or depression.  Counseling and behavior therapy. Therapy can help individuals and families behave in healthier ways and relate more effectively.  Support groups. Confidential group therapy, such as a 12-step program, can help individuals and families during treatment and recovery. No single type of program is right for everyone. Many treatment programs involve a combination of education, counseling, and a 12-step, spiritually-based approach. Some treatment programs are government sponsored. They are geared for patients who do not have private insurance. Treatment programs can vary in many respects, such as:  Cost and types of insurance that are accepted.  Types of on-site medical services that are offered.  Length of stay, setting, and size.  Overall philosophy of treatment. WHAT SHOULD I CONSIDER WHEN SELECTING A TREATMENT PROGRAM? It is important to think about your individual requirements when selecting a treatment program. There are a number of things to consider, such as:  If the program is certified by the appropriate government agency. Even private programs must be certified and employ certified professionals.  If the program is covered by your insurance. If finances are a concern, the first call you should make  is to [employer], if you have health insurance. Ask for a list of treatment programs that are in your network, and confirm any copayments and deductibles that you may have to pay.  If you do not have insurance, or if  you choose to attend a program that does not accept your insurance, discuss whether a payment plan can be set up.  If treatment is available in languages other than English, if needed.  If the program offers detoxification treatment, if needed.  If 12-step meetings are held at the center or if transport is available for patients to attend meetings at other locations.  If the program is professional, organized, and clean.  If the program meets all of your needs, including physical and cultural needs.  If the facility offers specific treatment for your particular addiction.  If support continues to be offered after you have left the program.  If your treatment plan is continually looked at to make sure you are receiving the right treatment at the right time.  If mental health counseling is part of your treatment.  If medicine is included in treatment, if needed.  If your family is included in your treatment plan and if support is offered to them throughout the treatment process.  How the treatment works to prevent relapse. WHERE ELSE CAN I GET HELP?  Your health care provider. Ask him or her to help you find addiction treatment. These discussions are confidential.  The [employer] on Alcoholism and Drug Dependence (NCADD). This group has information about treatment centers and programs for people who have an addiction and for family members.  The telephone number is 1-800-NCA-CALL (579-426-7562).  The website is https://ncadd.org/about-ncadd/our-affiliates  The Substance Abuse and Mental Health Services Administration [facility]). This group will help you find publicly funded treatment centers, help hotlines, and counseling services near you.  The telephone number is 1-800-662-HELP (236-095-2399).  The website is www.findtreatment.RockT[employer].pl In countries outside of the [place]. and [place], look in [employer] for contact information for services in your area.   This  information is not intended to replace advice given to you by your health care provider. Make sure you discuss any questions you have with your health care provider.   Document Released: 05/21/2005 Document Revised: 03/13/2015 Document Reviewed: 04/10/2014 Elsevier Interactive Patient Education 2016 Elsevier Inc.  Opioid Use Disorder Opioid use disorder is a mental disorder. It is the continued nonmedical use of opioids in spite of risks to health and well-being. Misused opioids include the street drug heroin. They also include pain medicines such as morphine, hydrocodone, oxycodone, and fentanyl. Opioids are very addictive. People who misuse opioids get an exaggerated feeling of well-being. Opioid use disorder often disrupts activities at home, work, or school. It may cause mental or physical problems.  A family history of opioid use disorder puts you at higher risk of it. People with opioid use disorder often misuse other drugs or have mental illness such as depression, posttraumatic stress disorder, or antisocial personality disorder. They also are at risk of suicide and death from overdose. SIGNS AND SYMPTOMS  Signs and symptoms of opioid use disorder include:  Use of opioids in larger amounts or over a longer period than intended.  Unsuccessful attempts to cut down or control opioid use.  A lot of time spent obtaining, using, or recovering from the effects of opioids.  A strong desire or urge to use opioids (craving).  Continued use of opioids in spite of major problems at work,  school, or home because of use.  Continued use of opioids in spite of relationship problems because of use.  Giving up or cutting down on important life activities because of opioid use.  Use of opioids over and over in situations when it is physically hazardous, such as driving a car.  Continued use of opioids in spite of a physical problem that is likely related to use. Physical problems can  include:  Severe constipation.  Poor nutrition.  Infertility.  Tuberculosis.  Aspiration pneumonia.  Infections such as human immunodeficiency virus (HIV) and hepatitis (from injecting opioids).  Continued use of opioids in spite of a mental problem that is likely related to use. Mental problems can include:  Depression.  Anxiety.  Hallucinations.  Sleep problems.  Loss of sexual function.  Need to use more and more opioids to get the same effect, or lessened effect over time with use of the same amount (tolerance).  Having withdrawal symptoms when opioid use is stopped, or using opioids to reduce or avoid withdrawal symptoms. Withdrawal symptoms include:  Depressed, anxious, or irritable mood.  Nausea, vomiting, diarrhea, or intestinal cramping.  Muscle aches or spasms.  Excessive tearing or runny nose.  Dilated pupils, sweating, or hairs standing on end.  Yawning.  Fever, raised blood pressure, or fast pulse.  Restlessness or trouble sleeping. This does not apply to people taking opioids for medical reasons only. DIAGNOSIS Opioid use disorder is diagnosed by your health care provider. You may be asked questions about your opioid use and and how it affects your life. A physical exam may be done. A drug screen may be ordered. You may be referred to a mental health professional. The diagnosis of opioid use disorder requires at least two symptoms within 12 months. The type of opioid use disorder you have depends on the number of signs and symptoms you have. The type may be:  Mild. Two or three signs and symptoms.   Moderate. Four or five signs and symptoms.   Severe. Six or more signs and symptoms. TREATMENT  Treatment is usually provided by mental health professionals with training in substance use disorders.The following options are available:  Detoxification.This is the first step in treatment for withdrawal. It is medically supervised withdrawal with the  use of medicines. These medicines lessen withdrawal symptoms. They also raise the chance of becoming opioid free.  Counseling, also known as talk therapy. Talk therapy addresses the reasons you use opioids. It also addresses ways to keep you from using again (relapse). The goals of talk therapy are to avoid relapse by:  Identifying and avoiding triggers for use.  Finding healthy ways to cope with stress.  Learning how to handle cravings.  Support groups. Support groups provide emotional support, advice, and guidance.  A medicine that blocks opioid receptors in your brain. This medicine can reduce opioid cravings that lead to relapse. This medicine also blocks the desired opioid effect when relapse occurs.  Opioids that are taken by mouth in place of the misused opioid (opioid maintenance treatment). These medicines satisfy cravings but are safer than commonly misused opioids. This often is the best option for people who continue to relapse with other treatments. HOME CARE INSTRUCTIONS   Take medicines only as directed by your health care provider.  Check with your health care provider before starting new medicines.  Keep all follow-up visits as directed by your health care provider. SEEK MEDICAL CARE IF:  You are not able to take your medicines as directed.  Your symptoms get worse. SEEK IMMEDIATE MEDICAL CARE IF:  You have serious thoughts about hurting yourself or others.  You may have taken an overdose of opioids. FOR MORE INFORMATION  National Institute on Drug Abuse: http://www.price-smith.com/  Substance Abuse and Mental Health Services Administration: [employer].com.pt   This information is not intended to replace advice given to you by your health care provider. Make sure you discuss any questions you have with your health care provider.   Document Released: 04/19/2007 Document Revised: 07/13/2014 Document Reviewed: 07/05/2013 Elsevier Interactive Patient Education 2016 Elsevier  Inc.   Hip Pain Your hip is the joint between your upper legs and your lower pelvis. The bones, cartilage, tendons, and muscles of your hip joint perform a lot of work each day supporting your body weight and allowing you to move around. Hip pain can range from a minor ache to severe pain in one or both of your hips. Pain may be felt on the inside of the hip joint near the groin, or the outside near the buttocks and upper thigh. You may have swelling or stiffness as well.  HOME CARE INSTRUCTIONS   Take medicines only as directed by your health care provider.  Apply ice to the injured area:  Put ice in a plastic bag.  Place a towel between your skin and the bag.  Leave the ice on for 15-20 minutes at a time, 3-4 times a day.  Keep your leg raised (elevated) when possible to lessen swelling.  Avoid activities that cause pain.  Follow specific exercises as directed by your health care provider.  Sleep with a pillow between your legs on your most comfortable side.  Record how often you have hip pain, the location of the pain, and what it feels like. SEEK MEDICAL CARE IF:   You are unable to put weight on your leg.  Your hip is red or swollen or very tender to touch.  Your pain or swelling continues or worsens after 1 week.  You have increasing difficulty walking.  You have a fever. SEEK IMMEDIATE MEDICAL CARE IF:   You have fallen.  You have a sudden increase in pain and swelling in your hip. MAKE SURE YOU:   Understand these instructions.  Will watch your condition.  Will get help right away if you are not doing well or get worse.   This information is not intended to replace advice given to you by your health care provider. Make sure you discuss any questions you have with your health care provider.   Document Released: 12/10/2009 Document Revised: 07/13/2014 Document Reviewed: 02/16/2013 Elsevier Interactive Patient Education [employer]

## 2015-05-13 NOTE — ED Notes (Signed)
Patient transported to X-ray 

## 2020-06-02 ENCOUNTER — Emergency Department (HOSPITAL_COMMUNITY): Payer: Medicaid Other

## 2020-06-02 ENCOUNTER — Emergency Department (HOSPITAL_COMMUNITY)
Admission: EM | Admit: 2020-06-02 | Discharge: 2020-06-02 | Disposition: A | Discharge status: Home / Self Care | Payer: Medicaid Other | Attending: Emergency Medicine | Admitting: Emergency Medicine

## 2020-06-02 ENCOUNTER — Encounter (HOSPITAL_COMMUNITY): Payer: Self-pay

## 2020-06-02 ENCOUNTER — Other Ambulatory Visit: Payer: Self-pay

## 2020-06-02 DIAGNOSIS — T402X1A Poisoning by other opioids, accidental (unintentional), initial encounter: Secondary | ICD-10-CM | POA: Diagnosis present

## 2020-06-02 DIAGNOSIS — F17210 Nicotine dependence, cigarettes, uncomplicated: Secondary | ICD-10-CM | POA: Insufficient documentation

## 2020-06-02 DIAGNOSIS — R000 Tachycardia, unspecified: Secondary | ICD-10-CM | POA: Insufficient documentation

## 2020-06-02 DIAGNOSIS — F1910 Other psychoactive substance abuse, uncomplicated: Secondary | ICD-10-CM | POA: Insufficient documentation

## 2020-06-02 DIAGNOSIS — N179 Acute kidney failure, unspecified: Secondary | ICD-10-CM | POA: Diagnosis not present

## 2020-06-02 DIAGNOSIS — R0681 Apnea, not elsewhere classified: Secondary | ICD-10-CM | POA: Diagnosis not present

## 2020-06-02 DIAGNOSIS — R4182 Altered mental status, unspecified: Secondary | ICD-10-CM | POA: Diagnosis not present

## 2020-06-02 DIAGNOSIS — T40601A Poisoning by unspecified narcotics, accidental (unintentional), initial encounter: Secondary | ICD-10-CM

## 2020-06-02 HISTORY — DX: Other specified health status: Z78.9

## 2020-06-02 LAB — COMPREHENSIVE METABOLIC PANEL
ALT: 17 U/L (ref 0–44)
AST: 34 U/L (ref 15–41)
Albumin: 4.6 g/dL (ref 3.5–5.0)
Alkaline Phosphatase: 65 U/L (ref 38–126)
Anion gap: 20 — ABNORMAL HIGH (ref 5–15)
BUN: 20 mg/dL (ref 6–20)
CO2: 21 mmol/L — ABNORMAL LOW (ref 22–32)
Calcium: 9.5 mg/dL (ref 8.9–10.3)
Chloride: 101 mmol/L (ref 98–111)
Creatinine, Ser: 1.55 mg/dL — ABNORMAL HIGH (ref 0.61–1.24)
GFR, Estimated: 52 mL/min — ABNORMAL LOW (ref >60)
Glucose, Bld: 153 mg/dL — ABNORMAL HIGH (ref 70–99)
Potassium: 4.6 mmol/L (ref 3.5–5.1)
Sodium: 142 mmol/L (ref 135–145)
Total Bilirubin: 0.4 mg/dL (ref 0.3–1.2)
Total Protein: 8.6 g/dL — ABNORMAL HIGH (ref 6.5–8.1)

## 2020-06-02 LAB — CBC WITH DIFFERENTIAL/PLATELET
Abs Immature Granulocytes: 0.18 10*3/uL — ABNORMAL HIGH (ref 0.00–0.07)
Basophils Absolute: 0 10*3/uL (ref 0.0–0.1)
Basophils Relative: 0 %
Eosinophils Absolute: 0 10*3/uL (ref 0.0–0.5)
Eosinophils Relative: 0 %
HCT: 49.6 % (ref 39.0–52.0)
Hemoglobin: 15.9 g/dL (ref 13.0–17.0)
Immature Granulocytes: 1 %
Lymphocytes Relative: 9 %
Lymphs Abs: 1.3 10*3/uL (ref 0.7–4.0)
MCH: 29 pg (ref 26.0–34.0)
MCHC: 32.1 g/dL (ref 30.0–36.0)
MCV: 90.5 fL (ref 80.0–100.0)
Monocytes Absolute: 0.6 10*3/uL (ref 0.1–1.0)
Monocytes Relative: 4 %
Neutro Abs: 11.5 10*3/uL — ABNORMAL HIGH (ref 1.7–7.7)
Neutrophils Relative %: 86 %
Platelets: 226 10*3/uL (ref 150–400)
RBC: 5.48 MIL/uL (ref 4.22–5.81)
RDW: 13.9 % (ref 11.5–15.5)
WBC: 13.6 10*3/uL — ABNORMAL HIGH (ref 4.0–10.5)
nRBC: 0 % (ref 0.0–0.2)

## 2020-06-02 LAB — ACETAMINOPHEN LEVEL: Acetaminophen (Tylenol), Serum: <10 ug/mL — ABNORMAL LOW (ref 10–30)

## 2020-06-02 LAB — RAPID URINE DRUG SCREEN, HOSP PERFORMED
Amphetamines: NOT DETECTED
Barbiturates: NOT DETECTED
Benzodiazepines: NOT DETECTED
Cocaine: POSITIVE — AB
Opiates: POSITIVE — AB
Tetrahydrocannabinol: POSITIVE — AB

## 2020-06-02 LAB — ETHANOL: Alcohol, Ethyl (B): <10 mg/dL (ref <10)

## 2020-06-02 LAB — MAGNESIUM: Magnesium: 2.4 mg/dL (ref 1.7–2.4)

## 2020-06-02 LAB — SALICYLATE LEVEL: Salicylate Lvl: <7 mg/dL — ABNORMAL LOW (ref 7.0–30.0)

## 2020-06-02 LAB — CBG MONITORING, ED: Glucose-Capillary: 167 mg/dL — ABNORMAL HIGH (ref 70–99)

## 2020-06-02 MED ORDER — SODIUM CHLORIDE 0.9 % IV BOLUS
1000.0000 mL | Freq: Once | INTRAVENOUS | Status: AC
  Administered 2020-06-02: 1000 mL via INTRAVENOUS

## 2020-06-02 MED ORDER — SODIUM CHLORIDE 0.9 % IV BOLUS
500.0000 mL | Freq: Once | INTRAVENOUS | Status: AC
  Administered 2020-06-02: 500 mL via INTRAVENOUS

## 2020-06-02 MED ORDER — ONDANSETRON HCL 4 MG/2ML IJ SOLN
4.0000 mg | Freq: Once | INTRAMUSCULAR | Status: AC
  Administered 2020-06-02: 4 mg via INTRAVENOUS
  Filled 2020-06-02: qty 2

## 2020-06-02 NOTE — ED Provider Notes (Signed)
[facility]-EMERGENCY DEPT Provider Note   CSN: [id number] Arrival date & time: 06/02/20  0139     History Chief Complaint  Patient presents with   Drug Overdose    pt overdose on after iv heroin. apnic on scene, had been down for 30 min ,pt. possible asperation     LEVEL 5 CAVEAT 2/2 AMS  SWADE SHONKA is a 59 y.o. male.  59 year old male with history of drug abuse presents to the emergency department via EMS following an overdose.  EMS reports that the patient was down for approximately 30 minutes prior to their arrival.  He was noted to be apneic on scene.  Was bagged for a time.  Did receive 2 mg intranasal Narcan with no response.  Received an additional 0.5 mg Narcan IV and became alert with return of spontaneous respirations.  Did experience vomiting on scene; suctioned.  Unknown ingestion, but hx of heroin abuse.  The history is provided by the EMS personnel.  Drug Overdose       Past Medical History:  Diagnosis Date   Known health problems: none     There are no problems to display for this patient.   Past Surgical History:  Procedure Laterality Date   BACK SURGERY         No family history on file.  Social History   Tobacco Use   Smoking status: Current Every Day Smoker    Packs/day: 0.50    Types: Cigarettes   Smokeless tobacco: Never Used  Substance Use Topics   Alcohol use: No   Drug use: Yes    Comment: heroin daily    Home Medications Prior to Admission medications   Medication Sig Start Date End Date Taking? Authorizing Provider  cyclobenzaprine (FLEXERIL) 5 MG tablet Take 1 tablet (5 mg total) by mouth 3 (three) times daily as needed for muscle spasms. Patient not taking: Reported on 05/13/2015 11/01/14   Arthor Captain, PA-C  dicyclomine (BENTYL) 20 MG tablet Take 1 tablet (20 mg total) by mouth 2 (two) times daily. 05/13/15   Harris, Abigail, PA-C  HORIZANT 600 MG TBCR Take 1 tablet by mouth 2 (two) times  daily. 05/31/20   [provider]  hydrOXYzine (ATARAX/VISTARIL) 25 MG tablet Take 1 tablet (25 mg total) by mouth every 6 (six) hours. 05/13/15   Harris, Abigail, PA-C  lidocaine (LIDODERM) 5 % Place 1 patch onto the skin daily. Remove & Discard patch within 12 hours or as directed by MD Patient not taking: Reported on 05/13/2015 11/01/14   Arthor Captain, PA-C  naproxen (NAPROSYN) 500 MG tablet Take 1 tablet (500 mg total) by mouth 2 (two) times daily with a meal. 05/13/15   Harris, Abigail, PA-C  ondansetron (ZOFRAN) 4 MG tablet Take 1 tablet (4 mg total) by mouth every 8 (eight) hours as needed for nausea or vomiting. 05/13/15   Harris, Abigail, PA-C  SUBOXONE 8-2 MG FILM Place under the tongue daily. 05/31/20   [provider]  tiZANidine (ZANAFLEX) 4 MG tablet Take 4 mg by mouth 3 (three) times daily as needed. 04/04/20   [provider]    Allergies    Patient has no known allergies.  Review of Systems   Review of Systems  Unable to perform ROS: Mental status change    Physical Exam Updated Vital Signs BP 116/75    Pulse (!) 107    Temp 98.4 F (36.9 C) (Oral)    Resp 14  Ht 5\' 9"  (1.753 m)    Wt 68 kg    SpO2 95%    BMI 22.15 kg/m   Physical Exam Vitals and nursing note reviewed.  Constitutional:      General: He is not in acute distress.    Appearance: He is well-developed. He is not diaphoretic.     Comments: Alert to voice and sternal rubbing.   HENT:     Head: Normocephalic and atraumatic.     Right Ear: External ear normal.     Left Ear: External ear normal.     Mouth/Throat:     Mouth: Mucous membranes are moist.  Eyes:     General: No scleral icterus.    Conjunctiva/sclera: Conjunctivae normal.  Cardiovascular:     Rate and Rhythm: Regular rhythm. Tachycardia present.     Pulses: Normal pulses.  Pulmonary:     Effort: Pulmonary effort is normal. No respiratory distress.     Comments: Faint rhonchi b/l bases. Otherwise clear. SpO2 low  90's on RA; placed on 2L via [place]. Abdominal:     Palpations: Abdomen is soft.     Comments: Soft abdomen. No guarding or peritoneal signs.  Musculoskeletal:        General: Normal range of motion.     Cervical back: Normal range of motion.  Skin:    General: Skin is warm and dry.     Coloration: Skin is not pale.     Findings: No erythema or rash.  Neurological:     Mental Status: He is alert.     Comments: Speech is slurred. Attempts to answer questions, but responses are jumbled. Does follow some commands. Moving extremities spontaneously.  Psychiatric:        Behavior: Behavior normal.     ED Results / Procedures / Treatments   Labs (all labs ordered are listed, but only abnormal results are displayed) Labs Reviewed  CBC WITH DIFFERENTIAL/PLATELET - Abnormal; Notable for the following components:      Result Value   WBC 13.6 (*)    Neutro Abs 11.5 (*)    Abs Immature Granulocytes 0.18 (*)    All other components within normal limits  COMPREHENSIVE METABOLIC PANEL - Abnormal; Notable for the following components:   CO2 21 (*)    Glucose, Bld 153 (*)    Creatinine, Ser 1.55 (*)    Total Protein 8.6 (*)    GFR, Estimated 52 (*)    Anion gap 20 (*)    All other components within normal limits  RAPID URINE DRUG SCREEN, HOSP PERFORMED - Abnormal; Notable for the following components:   Opiates POSITIVE (*)    Cocaine POSITIVE (*)    Tetrahydrocannabinol POSITIVE (*)    All other components within normal limits  ACETAMINOPHEN LEVEL - Abnormal; Notable for the following components:   Acetaminophen (Tylenol), Serum <10 (*)    All other components within normal limits  SALICYLATE LEVEL - Abnormal; Notable for the following components:   Salicylate Lvl <7.0 (*)    All other components within normal limits  CBG MONITORING, ED - Abnormal; Notable for the following components:   Glucose-Capillary 167 (*)    All other components within normal limits  ETHANOL  MAGNESIUM     EKG EKG Interpretation  Date/Time:  Sunday June 02 2020 02:03:53 EST Ventricular Rate:  105 PR Interval:    QRS Duration: 101 QT Interval:  369 QTC Calculation: 488 R Axis:   93 Text Interpretation: Sinus tachycardia Consider  right atrial enlargement Borderline right axis deviation Borderline prolonged QT interval Confirmed by Rochele Raring [phone]) on 06/02/2020 2:07:24 AM   Radiology DG Chest Portable 1 View  Result Date: 06/02/2020 CLINICAL DATA:  Drug overdose. EXAM: PORTABLE CHEST 1 VIEW COMPARISON:  May 11, 2010 FINDINGS: The heart size and mediastinal contours are within normal limits. Both lungs are clear. The visualized skeletal structures are unremarkable. IMPRESSION: No active disease. Electronically Signed   By: Katherine Mantle M.D.   On: 06/02/2020 02:38    Procedures Procedures (including critical care time)  Medications Ordered in ED Medications  ondansetron (ZOFRAN) injection 4 mg (4 mg Intravenous Given 06/02/20 0206)  sodium chloride 0.9 % bolus 1,000 mL (0 mLs Intravenous Stopped 06/02/20 0306)  sodium chloride 0.9 % bolus 500 mL (0 mLs Intravenous Stopped 06/02/20 0402)  sodium chloride 0.9 % bolus 1,000 mL (0 mLs Intravenous Stopped 06/02/20 0530)    ED Course  I have reviewed the triage vital signs and the nursing notes.  Pertinent labs & imaging results that were available during my care of the patient were reviewed by me and considered in my medical decision making (see chart for details).  Clinical Course as of Jun 03 543  Wynelle Link Jun 02, 2020  0932 Patient noted to be positive for opiates, cocaine, marijuana.  Mild AKI.  Is being hydrated in the ED with IV fluids.   [KH]  0415 BP 101/73 on recheck. Likely low previously as patient was sleeping. Easily awoken to loud voice. Speech is clear, can carry on meaningful conversation.   [KH]  0520 Ambulatory in the ED with 1 person assist. States he uses a cane at home at baseline.   [KH]   [phone] Wife en route to pick up patient from the ED.   [KH]    Clinical Course User Index [KH] Darylene Price   MDM Rules/Calculators/A&P                          59 year old male presents to the emergency department via EMS after being found unresponsive and apneic.  Responded to 2.5 mg Narcan given prior to arrival.  Has been hydrated in the ED with IV fluids and monitored.  Has remained hemodynamically stable.  UDS positive for opiates, cocaine, marijuana.  Is now more awake and alert.  He is able to ambulate with 1 person assist which is his baseline as he uses a cane at home.  Have advised patient against the use of illicit substances.  Will provide resource guide for detox and rehab facilities in the area.  Plan for discharge once wife arrives to pick up the patient.     Final Clinical Impression(s) / ED Diagnoses Final diagnoses:  Opiate overdose, accidental or unintentional, initial encounter (HCC)  Polysubstance abuse (HCC)  AKI (acute kidney injury) [facility])    Rx / DC Orders ED Discharge Orders    None       Antony Madura, PA-C 06/02/20 0546    Ward, Layla Maw, DO 06/02/20 [phone]

## 2020-06-02 NOTE — Discharge Instructions (Signed)
We have attached a resource guide with a list of counseling and substance abuse facilities in the area.  Contact these facilities if you are interested in detox/rehabilitation.

## 2020-06-02 NOTE — ED Notes (Signed)
Ambulated pt approximately 49ft Pt states he has a difficult time walking because he has rods in his back Pt uses cane at home Pt ambulated 68ft and back to bed with 1 person assist, gait steady Pt stated that he felt lightheaded while ambulating  Pt V/S on returning to bed: P 114 BP 116/75 (sitting)  Pt also states that he wants to go to detox

## 2020-06-02 NOTE — ED Notes (Signed)
Wife notified per pt request and is on the way to pick him up.

## 2023-03-07 DEATH — deceased
